# Patient Record
Sex: Male | Born: 2000 | Race: White | Hispanic: No | Marital: Single | Smoking: Former smoker
Health system: Southern US, Community
[De-identification: ages and names within clinical notes are randomized; demographics above are authoritative.]

## PROBLEM LIST (undated history)

## (undated) DIAGNOSIS — S329XXA Fracture of unspecified parts of lumbosacral spine and pelvis, initial encounter for closed fracture: Secondary | ICD-10-CM

## (undated) DIAGNOSIS — T50901A Poisoning by unspecified drugs, medicaments and biological substances, accidental (unintentional), initial encounter: Secondary | ICD-10-CM

## (undated) DIAGNOSIS — F99 Mental disorder, not otherwise specified: Secondary | ICD-10-CM

## (undated) DIAGNOSIS — S7290XA Unspecified fracture of unspecified femur, initial encounter for closed fracture: Secondary | ICD-10-CM

## (undated) DIAGNOSIS — T8851XA Hypothermia following anesthesia, initial encounter: Secondary | ICD-10-CM

## (undated) DIAGNOSIS — J302 Other seasonal allergic rhinitis: Secondary | ICD-10-CM

## (undated) HISTORY — PX: OTHER SURGICAL HISTORY: SHX169

## (undated) HISTORY — PX: ORIF DISTAL RADIUS FRACTURE: SUR927

## (undated) HISTORY — DX: Poisoning by unspecified drugs, medicaments and biological substances, accidental (unintentional), initial encounter: T50.901A

---
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
## 2021-04-09 ENCOUNTER — Ambulatory Visit: Payer: Self-pay | Admitting: *Deleted
